# Patient Record
Sex: Male | Born: 1955 | Race: White | Hispanic: No | Smoking: Current every day smoker
Health system: Southern US, Community
[De-identification: ages and names within clinical notes are randomized; demographics above are authoritative.]

## PROBLEM LIST (undated history)

## (undated) DIAGNOSIS — J449 Chronic obstructive pulmonary disease, unspecified: Secondary | ICD-10-CM

## (undated) HISTORY — PX: KNEE SURGERY: SHX244

## (undated) HISTORY — PX: ANKLE SURGERY: SHX546

## (undated) HISTORY — PX: APPENDECTOMY: SHX54

---

## 2017-11-21 ENCOUNTER — Encounter (HOSPITAL_COMMUNITY)
Admission: EM | Disposition: A | Discharge status: Home / Self Care | Payer: Self-pay | Source: Home / Self Care | Attending: Cardiovascular Disease

## 2017-11-21 ENCOUNTER — Inpatient Hospital Stay (HOSPITAL_COMMUNITY): Payer: No Typology Code available for payment source

## 2017-11-21 ENCOUNTER — Other Ambulatory Visit: Payer: Self-pay

## 2017-11-21 ENCOUNTER — Inpatient Hospital Stay (HOSPITAL_COMMUNITY)
Admission: EM | Admit: 2017-11-21 | Discharge: 2017-11-23 | DRG: 247 | Disposition: A | Discharge status: Home / Self Care | Payer: No Typology Code available for payment source | Attending: Cardiovascular Disease | Admitting: Cardiovascular Disease

## 2017-11-21 ENCOUNTER — Emergency Department (HOSPITAL_COMMUNITY): Payer: No Typology Code available for payment source

## 2017-11-21 ENCOUNTER — Encounter (HOSPITAL_COMMUNITY): Payer: Self-pay

## 2017-11-21 DIAGNOSIS — J449 Chronic obstructive pulmonary disease, unspecified: Secondary | ICD-10-CM | POA: Diagnosis present

## 2017-11-21 DIAGNOSIS — I2510 Atherosclerotic heart disease of native coronary artery without angina pectoris: Secondary | ICD-10-CM | POA: Diagnosis present

## 2017-11-21 DIAGNOSIS — F17210 Nicotine dependence, cigarettes, uncomplicated: Secondary | ICD-10-CM | POA: Diagnosis present

## 2017-11-21 DIAGNOSIS — E785 Hyperlipidemia, unspecified: Secondary | ICD-10-CM | POA: Diagnosis present

## 2017-11-21 DIAGNOSIS — Z79899 Other long term (current) drug therapy: Secondary | ICD-10-CM

## 2017-11-21 DIAGNOSIS — I213 ST elevation (STEMI) myocardial infarction of unspecified site: Secondary | ICD-10-CM | POA: Diagnosis present

## 2017-11-21 DIAGNOSIS — Z720 Tobacco use: Secondary | ICD-10-CM

## 2017-11-21 DIAGNOSIS — I2121 ST elevation (STEMI) myocardial infarction involving left circumflex coronary artery: Secondary | ICD-10-CM | POA: Diagnosis present

## 2017-11-21 DIAGNOSIS — I5030 Unspecified diastolic (congestive) heart failure: Secondary | ICD-10-CM

## 2017-11-21 DIAGNOSIS — Z9861 Coronary angioplasty status: Secondary | ICD-10-CM

## 2017-11-21 DIAGNOSIS — R079 Chest pain, unspecified: Secondary | ICD-10-CM

## 2017-11-21 DIAGNOSIS — Z955 Presence of coronary angioplasty implant and graft: Secondary | ICD-10-CM

## 2017-11-21 HISTORY — DX: Chronic obstructive pulmonary disease, unspecified: J44.9

## 2017-11-21 HISTORY — PX: LEFT HEART CATH AND CORONARY ANGIOGRAPHY: CATH118249

## 2017-11-21 HISTORY — PX: CORONARY/GRAFT ACUTE MI REVASCULARIZATION: CATH118305

## 2017-11-21 LAB — COMPREHENSIVE METABOLIC PANEL
ALT: 15 U/L (ref 0–44)
AST: 17 U/L (ref 15–41)
Albumin: 3.8 g/dL (ref 3.5–5.0)
Alkaline Phosphatase: 70 U/L (ref 38–126)
Anion gap: 7 (ref 5–15)
BILIRUBIN TOTAL: 0.6 mg/dL (ref 0.3–1.2)
BUN: 17 mg/dL (ref 6–20)
CO2: 26 mmol/L (ref 22–32)
Calcium: 9 mg/dL (ref 8.9–10.3)
Chloride: 105 mmol/L (ref 98–111)
Creatinine, Ser: 0.94 mg/dL (ref 0.61–1.24)
GFR calc Af Amer: >60 mL/min (ref >60)
GLUCOSE: 150 mg/dL — AB (ref 70–99)
Potassium: 3.8 mmol/L (ref 3.5–5.1)
Sodium: 138 mmol/L (ref 135–145)
TOTAL PROTEIN: 6.4 g/dL — AB (ref 6.5–8.1)

## 2017-11-21 LAB — LIPID PANEL
CHOL/HDL RATIO: 7.3 ratio
Cholesterol: 257 mg/dL — ABNORMAL HIGH (ref 0–200)
HDL: 35 mg/dL — ABNORMAL LOW (ref >40)
LDL CALC: 200 mg/dL — AB (ref 0–99)
Triglycerides: 112 mg/dL (ref <150)
VLDL: 22 mg/dL (ref 0–40)

## 2017-11-21 LAB — ECHOCARDIOGRAM COMPLETE
HEIGHTINCHES: 70 in
Weight: 3280.44 oz

## 2017-11-21 LAB — POCT I-STAT, CHEM 8
BUN: 20 mg/dL (ref 6–20)
CALCIUM ION: 1.18 mmol/L (ref 1.15–1.40)
CHLORIDE: 104 mmol/L (ref 98–111)
Creatinine, Ser: 0.8 mg/dL (ref 0.61–1.24)
GLUCOSE: 143 mg/dL — AB (ref 70–99)
HCT: 40 % (ref 39.0–52.0)
Hemoglobin: 13.6 g/dL (ref 13.0–17.0)
POTASSIUM: 3.7 mmol/L (ref 3.5–5.1)
Sodium: 139 mmol/L (ref 135–145)
TCO2: 25 mmol/L (ref 22–32)

## 2017-11-21 LAB — HEMOGLOBIN A1C
Hgb A1c MFr Bld: 5.4 % (ref 4.8–5.6)
Mean Plasma Glucose: 108.28 mg/dL

## 2017-11-21 LAB — CBC WITH DIFFERENTIAL/PLATELET
Abs Immature Granulocytes: 0.08 10*3/uL — ABNORMAL HIGH (ref 0.00–0.07)
Basophils Absolute: 0.1 10*3/uL (ref 0.0–0.1)
Basophils Relative: 1 %
EOS ABS: 0.2 10*3/uL (ref 0.0–0.5)
EOS PCT: 1 %
HEMATOCRIT: 46.2 % (ref 39.0–52.0)
Hemoglobin: 14 g/dL (ref 13.0–17.0)
Immature Granulocytes: 1 %
LYMPHS ABS: 1.9 10*3/uL (ref 0.7–4.0)
Lymphocytes Relative: 16 %
MCH: 28 pg (ref 26.0–34.0)
MCHC: 30.3 g/dL (ref 30.0–36.0)
MCV: 92.4 fL (ref 80.0–100.0)
MONOS PCT: 7 %
Monocytes Absolute: 0.9 10*3/uL (ref 0.1–1.0)
NRBC: 0 % (ref 0.0–0.2)
Neutro Abs: 8.9 10*3/uL — ABNORMAL HIGH (ref 1.7–7.7)
Neutrophils Relative %: 74 %
Platelets: 369 10*3/uL (ref 150–400)
RBC: 5 MIL/uL (ref 4.22–5.81)
RDW: 13.8 % (ref 11.5–15.5)
WBC: 12 10*3/uL — ABNORMAL HIGH (ref 4.0–10.5)

## 2017-11-21 LAB — TROPONIN I
TROPONIN I: 0.07 ng/mL — AB (ref <0.03)
TROPONIN I: 2.43 ng/mL — AB (ref <0.03)
TROPONIN I: 9.82 ng/mL — AB (ref <0.03)
Troponin I: 11.32 ng/mL (ref <0.03)

## 2017-11-21 LAB — POCT ACTIVATED CLOTTING TIME
ACTIVATED CLOTTING TIME: 351 s
Activated Clotting Time: 835 seconds

## 2017-11-21 LAB — PROTIME-INR
INR: 0.95
Prothrombin Time: 12.6 seconds (ref 11.4–15.2)

## 2017-11-21 LAB — APTT: aPTT: 29 seconds (ref 24–36)

## 2017-11-21 LAB — TSH: TSH: 0.966 u[IU]/mL (ref 0.350–4.500)

## 2017-11-21 LAB — T4, FREE: FREE T4: 0.89 ng/dL (ref 0.82–1.77)

## 2017-11-21 LAB — MAGNESIUM: Magnesium: 2.2 mg/dL (ref 1.7–2.4)

## 2017-11-21 LAB — PLATELET COUNT: Platelets: 304 10*3/uL (ref 150–400)

## 2017-11-21 LAB — MRSA PCR SCREENING: MRSA by PCR: NEGATIVE

## 2017-11-21 LAB — BRAIN NATRIURETIC PEPTIDE: B Natriuretic Peptide: 41.1 pg/mL (ref 0.0–100.0)

## 2017-11-21 LAB — HIV ANTIBODY (ROUTINE TESTING W REFLEX): HIV Screen 4th Generation wRfx: NONREACTIVE

## 2017-11-21 SURGERY — CORONARY/GRAFT ACUTE MI REVASCULARIZATION
Anesthesia: LOCAL

## 2017-11-21 MED ORDER — ATORVASTATIN CALCIUM 80 MG PO TABS: 80.0000 mg | ORAL_TABLET | Freq: Every day | ORAL | Status: DC

## 2017-11-21 MED ORDER — SODIUM CHLORIDE 0.9 % IV SOLN
INTRAVENOUS | Status: AC | PRN
  Administered 2017-11-21: 250 mL
  Administered 2017-11-21: 100 mL/h via INTRAVENOUS

## 2017-11-21 MED ORDER — IOHEXOL 350 MG/ML SOLN
INTRAVENOUS | Status: DC | PRN
  Administered 2017-11-21: 295 mL via INTRAVENOUS

## 2017-11-21 MED ORDER — ORAL CARE MOUTH RINSE
15.0000 mL | Freq: Two times a day (BID) | OROMUCOSAL | Status: DC
  Administered 2017-11-23: 15 mL via OROMUCOSAL

## 2017-11-21 MED ORDER — BIVALIRUDIN TRIFLUOROACETATE 250 MG IV SOLR
INTRAVENOUS | Status: AC
  Filled 2017-11-21: qty 250

## 2017-11-21 MED ORDER — PERFLUTREN LIPID MICROSPHERE
1.0000 mL | INTRAVENOUS | Status: AC | PRN
  Administered 2017-11-21: 4 mL via INTRAVENOUS
  Filled 2017-11-21: qty 10

## 2017-11-21 MED ORDER — BIVALIRUDIN BOLUS VIA INFUSION - CUPID
INTRAVENOUS | Status: DC | PRN
  Administered 2017-11-21: 99.678 mg via INTRAVENOUS

## 2017-11-21 MED ORDER — SODIUM CHLORIDE 0.9% FLUSH
3.0000 mL | Freq: Two times a day (BID) | INTRAVENOUS | Status: DC
  Administered 2017-11-21 (×2): 3 mL via INTRAVENOUS

## 2017-11-21 MED ORDER — HEPARIN (PORCINE) IN NACL 1000-0.9 UT/500ML-% IV SOLN
INTRAVENOUS | Status: DC | PRN
  Administered 2017-11-21: 500 mL

## 2017-11-21 MED ORDER — ASPIRIN EC 81 MG PO TBEC: 81.0000 mg | DELAYED_RELEASE_TABLET | Freq: Every day | ORAL | Status: DC

## 2017-11-21 MED ORDER — HEPARIN SODIUM (PORCINE) 5000 UNIT/ML IJ SOLN
4000.0000 [IU] | Freq: Once | INTRAMUSCULAR | Status: AC
  Administered 2017-11-21: 4000 [IU] via INTRAVENOUS

## 2017-11-21 MED ORDER — VERAPAMIL HCL 2.5 MG/ML IV SOLN
INTRAVENOUS | Status: DC | PRN
  Administered 2017-11-21: 10 mL via INTRA_ARTERIAL

## 2017-11-21 MED ORDER — PERFLUTREN LIPID MICROSPHERE
INTRAVENOUS | Status: AC
  Filled 2017-11-21: qty 10

## 2017-11-21 MED ORDER — ONDANSETRON HCL 4 MG/2ML IJ SOLN: 4.0000 mg | Freq: Four times a day (QID) | INTRAMUSCULAR | Status: DC | PRN

## 2017-11-21 MED ORDER — DIAZEPAM 5 MG PO TABS: 5.0000 mg | ORAL_TABLET | Freq: Four times a day (QID) | ORAL | Status: DC | PRN

## 2017-11-21 MED ORDER — ATORVASTATIN CALCIUM 80 MG PO TABS
80.0000 mg | ORAL_TABLET | Freq: Every day | ORAL | Status: DC
  Administered 2017-11-21 – 2017-11-22 (×2): 80 mg via ORAL
  Filled 2017-11-21 (×2): qty 1

## 2017-11-21 MED ORDER — NITROGLYCERIN 1 MG/10 ML FOR IR/CATH LAB
INTRA_ARTERIAL | Status: DC | PRN
  Administered 2017-11-21: 100 ug via INTRACORONARY
  Administered 2017-11-21 (×3): 200 ug via INTRACORONARY

## 2017-11-21 MED ORDER — TIROFIBAN HCL IN NACL 5-0.9 MG/100ML-% IV SOLN
INTRAVENOUS | Status: AC | PRN
  Administered 2017-11-21: 0.15 ug/kg/min via INTRAVENOUS

## 2017-11-21 MED ORDER — LEVALBUTEROL HCL 0.63 MG/3ML IN NEBU
0.6300 mg | INHALATION_SOLUTION | Freq: Three times a day (TID) | RESPIRATORY_TRACT | Status: DC
  Administered 2017-11-21 (×2): 0.63 mg via RESPIRATORY_TRACT
  Filled 2017-11-21 (×2): qty 3

## 2017-11-21 MED ORDER — VERAPAMIL HCL 2.5 MG/ML IV SOLN
INTRAVENOUS | Status: AC
  Filled 2017-11-21: qty 2

## 2017-11-21 MED ORDER — TIROFIBAN HCL IN NACL 5-0.9 MG/100ML-% IV SOLN
INTRAVENOUS | Status: AC
  Filled 2017-11-21: qty 100

## 2017-11-21 MED ORDER — SODIUM CHLORIDE 0.9% FLUSH: 3.0000 mL | INTRAVENOUS | Status: DC | PRN

## 2017-11-21 MED ORDER — MIDAZOLAM HCL 2 MG/2ML IJ SOLN
INTRAMUSCULAR | Status: AC
  Filled 2017-11-21: qty 2

## 2017-11-21 MED ORDER — IPRATROPIUM-ALBUTEROL 0.5-2.5 (3) MG/3ML IN SOLN
3.0000 mL | RESPIRATORY_TRACT | Status: DC
  Administered 2017-11-21 – 2017-11-23 (×9): 3 mL via RESPIRATORY_TRACT
  Filled 2017-11-21 (×9): qty 3

## 2017-11-21 MED ORDER — METOPROLOL TARTRATE 12.5 MG HALF TABLET
12.5000 mg | ORAL_TABLET | Freq: Two times a day (BID) | ORAL | Status: DC
  Administered 2017-11-21 – 2017-11-23 (×5): 12.5 mg via ORAL
  Filled 2017-11-21 (×5): qty 1

## 2017-11-21 MED ORDER — TIROFIBAN HCL IN NACL 5-0.9 MG/100ML-% IV SOLN
0.1500 ug/kg/min | INTRAVENOUS | Status: DC
  Administered 2017-11-21 (×3): 0.15 ug/kg/min via INTRAVENOUS
  Filled 2017-11-21 (×5): qty 100

## 2017-11-21 MED ORDER — HYDRALAZINE HCL 20 MG/ML IJ SOLN: 5.0000 mg | INTRAMUSCULAR | Status: AC | PRN

## 2017-11-21 MED ORDER — ASPIRIN 81 MG PO CHEW: 324.0000 mg | CHEWABLE_TABLET | Freq: Once | ORAL | Status: DC

## 2017-11-21 MED ORDER — MIDAZOLAM HCL 2 MG/2ML IJ SOLN
INTRAMUSCULAR | Status: DC | PRN
  Administered 2017-11-21 (×2): 1 mg via INTRAVENOUS
  Administered 2017-11-21: 2 mg via INTRAVENOUS

## 2017-11-21 MED ORDER — FENTANYL CITRATE (PF) 100 MCG/2ML IJ SOLN
INTRAMUSCULAR | Status: DC | PRN
  Administered 2017-11-21 (×2): 25 ug via INTRAVENOUS

## 2017-11-21 MED ORDER — NITROGLYCERIN 1 MG/10 ML FOR IR/CATH LAB
INTRA_ARTERIAL | Status: AC
  Filled 2017-11-21: qty 10

## 2017-11-21 MED ORDER — TIROFIBAN (AGGRASTAT) BOLUS VIA INFUSION
INTRAVENOUS | Status: DC | PRN
  Administered 2017-11-21: 2325 ug via INTRAVENOUS

## 2017-11-21 MED ORDER — TICAGRELOR 90 MG PO TABS
90.0000 mg | ORAL_TABLET | Freq: Two times a day (BID) | ORAL | Status: DC
  Administered 2017-11-21 – 2017-11-23 (×4): 90 mg via ORAL
  Filled 2017-11-21 (×4): qty 1

## 2017-11-21 MED ORDER — SODIUM CHLORIDE 0.9 % IV SOLN: 250.0000 mL | INTRAVENOUS | Status: DC | PRN

## 2017-11-21 MED ORDER — ACETAMINOPHEN 325 MG PO TABS: 650.0000 mg | ORAL_TABLET | ORAL | Status: DC | PRN

## 2017-11-21 MED ORDER — FENTANYL CITRATE (PF) 100 MCG/2ML IJ SOLN
INTRAMUSCULAR | Status: AC
  Filled 2017-11-21: qty 2

## 2017-11-21 MED ORDER — METOPROLOL TARTRATE 5 MG/5ML IV SOLN
INTRAVENOUS | Status: AC
  Filled 2017-11-21: qty 5

## 2017-11-21 MED ORDER — MOMETASONE FURO-FORMOTEROL FUM 200-5 MCG/ACT IN AERO
2.0000 | INHALATION_SPRAY | Freq: Two times a day (BID) | RESPIRATORY_TRACT | Status: DC
  Administered 2017-11-21 – 2017-11-23 (×3): 2 via RESPIRATORY_TRACT
  Filled 2017-11-21 (×2): qty 8.8

## 2017-11-21 MED ORDER — NICOTINE 14 MG/24HR TD PT24
14.0000 mg | MEDICATED_PATCH | Freq: Every day | TRANSDERMAL | Status: DC
  Administered 2017-11-21 – 2017-11-23 (×3): 14 mg via TRANSDERMAL
  Filled 2017-11-21 (×3): qty 1

## 2017-11-21 MED ORDER — HEPARIN (PORCINE) IN NACL 1000-0.9 UT/500ML-% IV SOLN
INTRAVENOUS | Status: AC
  Filled 2017-11-21: qty 1000

## 2017-11-21 MED ORDER — SODIUM CHLORIDE 0.9 % IV SOLN
1.7500 mg/kg/h | INTRAVENOUS | Status: AC
  Administered 2017-11-21: 1.75 mg/kg/h via INTRAVENOUS
  Filled 2017-11-21 (×2): qty 250

## 2017-11-21 MED ORDER — ACETAMINOPHEN 325 MG PO TABS
650.0000 mg | ORAL_TABLET | ORAL | Status: DC | PRN
  Administered 2017-11-22: 650 mg via ORAL
  Filled 2017-11-21: qty 2

## 2017-11-21 MED ORDER — SODIUM CHLORIDE 0.9 % IV SOLN
INTRAVENOUS | Status: DC
  Administered 2017-11-21 (×2): via INTRAVENOUS

## 2017-11-21 MED ORDER — ASPIRIN 81 MG PO CHEW
81.0000 mg | CHEWABLE_TABLET | Freq: Every day | ORAL | Status: DC
  Administered 2017-11-22 – 2017-11-23 (×2): 81 mg via ORAL
  Filled 2017-11-21 (×3): qty 1

## 2017-11-21 MED ORDER — NITROGLYCERIN 0.4 MG SL SUBL: 0.4000 mg | SUBLINGUAL_TABLET | SUBLINGUAL | Status: DC | PRN

## 2017-11-21 MED ORDER — LIDOCAINE HCL (PF) 1 % IJ SOLN
INTRAMUSCULAR | Status: DC | PRN
  Administered 2017-11-21: 2 mL

## 2017-11-21 MED ORDER — SODIUM CHLORIDE 0.9 % IV SOLN: INTRAVENOUS | Status: DC

## 2017-11-21 MED ORDER — TIROFIBAN HCL IN NACL 5-0.9 MG/100ML-% IV SOLN
INTRAVENOUS | Status: AC | PRN
  Administered 2017-11-21: 0.075 ug/kg/min via INTRAVENOUS

## 2017-11-21 MED ORDER — TICAGRELOR 90 MG PO TABS
ORAL_TABLET | ORAL | Status: AC
  Filled 2017-11-21: qty 2

## 2017-11-21 MED ORDER — TICAGRELOR 90 MG PO TABS
ORAL_TABLET | ORAL | Status: DC | PRN
  Administered 2017-11-21: 180 mg via ORAL

## 2017-11-21 MED ORDER — LABETALOL HCL 5 MG/ML IV SOLN: 10.0000 mg | INTRAVENOUS | Status: AC | PRN

## 2017-11-21 MED ORDER — SODIUM CHLORIDE 0.9 % IV SOLN
INTRAVENOUS | Status: AC | PRN
  Administered 2017-11-21 (×2): 1.75 mg/kg/h via INTRAVENOUS

## 2017-11-21 MED ORDER — ALPRAZOLAM 0.25 MG PO TABS: 0.2500 mg | ORAL_TABLET | Freq: Two times a day (BID) | ORAL | Status: DC | PRN

## 2017-11-21 MED ORDER — LIDOCAINE HCL (PF) 1 % IJ SOLN
INTRAMUSCULAR | Status: AC
  Filled 2017-11-21: qty 30

## 2017-11-21 MED ORDER — METOPROLOL TARTRATE 5 MG/5ML IV SOLN
INTRAVENOUS | Status: DC | PRN
  Administered 2017-11-21: 5 mg via INTRAVENOUS

## 2017-11-21 SURGICAL SUPPLY — 21 items
BALLN SAPPHIRE 2.0X12 (BALLOONS) ×2
BALLN SAPPHIRE 2.5X15 (BALLOONS) ×2
BALLN SAPPHIRE ~~LOC~~ 3.0X15 (BALLOONS) ×2 IMPLANT
BALLOON SAPPHIRE 2.0X12 (BALLOONS) ×1 IMPLANT
BALLOON SAPPHIRE 2.5X15 (BALLOONS) ×1 IMPLANT
CATH INFINITI 5FR ANG PIGTAIL (CATHETERS) ×2 IMPLANT
CATH INFINITI JR4 5F (CATHETERS) ×2 IMPLANT
CATH OPTITORQUE TIG 4.0 5F (CATHETERS) ×2 IMPLANT
CATH VISTA GUIDE 6FR XB3.5 (CATHETERS) ×2 IMPLANT
DEVICE RAD COMP TR BAND LRG (VASCULAR PRODUCTS) ×2 IMPLANT
GLIDESHEATH SLEND SS 6F .021 (SHEATH) ×2 IMPLANT
GUIDEWIRE INQWIRE 1.5J.035X260 (WIRE) ×1 IMPLANT
INQWIRE 1.5J .035X260CM (WIRE) ×2
KIT ENCORE 26 ADVANTAGE (KITS) ×2 IMPLANT
KIT HEART LEFT (KITS) ×2 IMPLANT
PACK CARDIAC CATHETERIZATION (CUSTOM PROCEDURE TRAY) ×2 IMPLANT
STENT SYNERGY DES 2.75X20 (Permanent Stent) ×2 IMPLANT
SYR MEDRAD MARK V 150ML (SYRINGE) ×2 IMPLANT
TRANSDUCER W/STOPCOCK (MISCELLANEOUS) ×2 IMPLANT
TUBING CIL FLEX 10 FLL-RA (TUBING) ×2 IMPLANT
WIRE PT2 MS 185 (WIRE) ×2 IMPLANT

## 2017-11-21 NOTE — Progress Notes (Signed)
  Echocardiogram 2D Echocardiogram has been performed.  Belva Chimes 11/21/2017, 3:07 PM

## 2017-11-21 NOTE — ED Notes (Signed)
Patient heading to cath lab with RN

## 2017-11-21 NOTE — Progress Notes (Signed)
CRITICAL VALUE ALERT  Critical Value:  Troponin 2.43  Date & Time Notied:  11/21/2017  At  0955  Provider Notified: paged NP Julien Girt at [phone] and [phone]  Orders Received/Actions taken: NP Julien Girt returned call at 1015, expected post cath troponin value

## 2017-11-21 NOTE — Progress Notes (Signed)
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   11/21/17 0700  Clinical Encounter Type  Visited With Patient not available   Phebe Colla, Chaplain

## 2017-11-21 NOTE — H&P (Addendum)
Cardiology Admission History and Physical:   Patient ID: Robert Cox MRN: [id number]; DOB: [birth year]   Admission date: 11/21/2017  Primary Care Provider: No primary care provider on file. 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  He took tums yesterday several times with mild improvement and symptoms continued.  He did fall asleep for 2 hours then woke with worsening pain.  This is described as tightness/heaviness in center of his chest.  Some SOB no associated nausea or diaphoresis.  Lives in [place].  Hx of COPD  EKG with ST elevation in II, III, AVF.  J point elevation in lateral leads.   I  Personally reviewed.   CODE STEMI was called and pt taken to cath lab for emergent cath.    WBC 12.0, Hgb 14, pls 369, INR 0.95,  CMP pending   CXR :  Mild left-sided atelectasis noted; lungs otherwise clear.   Past Medical History:  Diagnosis Date  . COPD (chronic obstructive pulmonary disease) (HCC)     Past Surgical History:  Procedure Laterality Date  . ANKLE SURGERY    . APPENDECTOMY    . KNEE SURGERY       Medications Prior to Admission: Prior to Admission medications   Not on File   pt on medication for prostate and albuterol neb.  He does not know meds.   Allergies:   Not on File  Social History:   Social History   Socioeconomic History  . Marital status: Not on file    Spouse name: Not on file  . Number of children: Not on file  . Years of education: Not on file  . Highest education level: Not on file  Occupational History  . Not on file  Social Needs  . Financial resource strain: Not on file  . Food insecurity:   Worry: Not on file    Inability: Not on file  . Transportation needs:    Medical: Not on file    Non-medical: Not on file  Tobacco Use  . Smoking status: Current Every Day Smoker    Packs/day: 4.00  . Smokeless tobacco: Never Used  Substance and Sexual Activity  . Alcohol use: Yes    Comment: occ  . Drug use: Never  . Sexual activity: Not on file  Lifestyle  . Physical activity:    Days per week: Not on file    Minutes per session: Not on file  . Stress: Not on file  Relationships  . Social connections:    Talks on phone: Not on file    Gets together: Not on file    Attends religious service: Not on file    Active member of club or organization: Not on file    Attends meetings of clubs or organizations: Not on file    Relationship status: Not on file  . Intimate partner violence:    Fear of current or ex partner: Not on file    Emotionally abused: Not on file    Physically abused: Not on file    Forced sexual activity: Not on file  Other Topics Concern  . Not  on file  Social History Narrative  . Not on file    Family History:   The patient's family history includes Heart disease in his father.    ROS:  Please see the history of present illness.  General:no colds or fevers, no weight changes Skin:no rashes or ulcers HEENT:no blurred vision, no congestion CV:see HPI PUL:see HPI GI:no diarrhea constipation or melena, no indigestion GU:no hematuria, no dysuria MS:no joint pain, no claudication Neuro:no syncope, no lightheadedness Endo:no diabetes, no thyroid disease All other ROS reviewed and negative.     Physical Exam/Data:   Vitals:   11/21/17 0524  SpO2: 92%   No intake or output data in the 24 hours ending 11/21/17 0728 There were no vitals filed for this visit. There is no height or weight on file to calculate BMI.  General:  Well nourished, well developed, in no acute distress though anxious HEENT: normal Lymph: no adenopathy Neck: no  JVD Endocrine:  No thryomegaly Vascular: No carotid bruits; pedal pulses 3+ bilaterally  Cardiac:  normal S1, S2; RRR; no murmur gallup rub or click Lungs:  clear to auscultation bilaterally, ant position, no wheezing, rhonchi or rales  Abd: soft, nontender, no hepatomegaly  Ext: no lower ext edema Musculoskeletal:  No deformities, BUE and BLE strength normal and equal Skin: warm and dry  Neuro:  Alert and oriented X 3 MAE follows commands, no focal abnormalities noted Psych:  Normal affect      Relevant CV Studies: None prior to admit  Laboratory Data:  Chemistry Recent Labs  Lab 11/21/17 0528  NA 138  K 3.8  CL 105  CO2 26  GLUCOSE 150*  BUN 17  CREATININE 0.94  CALCIUM 9.0  GFRNONAA >60  GFRAA >60  ANIONGAP 7    Recent Labs  Lab 11/21/17 0528  PROT 6.4*  ALBUMIN 3.8  AST 17  ALT 15  ALKPHOS 70  BILITOT 0.6   Hematology Recent Labs  Lab 11/21/17 0528  WBC 12.0*  RBC 5.00  HGB 14.0  HCT 46.2  MCV 92.4  MCH 28.0  MCHC 30.3  RDW 13.8  PLT 369   Cardiac Enzymes Recent Labs  Lab 11/21/17 0528  TROPONINI 0.07*   No results for input(s): TROPIPOC in the last 168 hours.  BNPNo results for input(s): BNP, PROBNP in the last 168 hours.  DDimer No results for input(s): DDIMER in the last 168 hours.  Radiology/Studies:  Dg Chest Port 1 View  Result Date: 11/21/2017 CLINICAL DATA:  Code ST-elevation myocardial infarction. Acute onset of generalized chest pain. EXAM: PORTABLE CHEST 1 VIEW COMPARISON:  None. FINDINGS: The lungs are well-aerated. Mild left-sided atelectasis is noted. No pleural effusion or pneumothorax is seen. The cardiomediastinal silhouette is within normal limits. No acute osseous abnormalities are seen. IMPRESSION: Mild left-sided atelectasis noted; lungs otherwise clear. Electronically Signed   By: Roanna Raider M.D.   On: 11/21/2017 05:43    Assessment and Plan:   1. STEMI Inf lat wall, emergently to cath lab, serial troponins,  asa has been given. Admit to ICU. 2. COPD on albuterol nebulizer at home.  3. + tobacco. Added xanax and nicoderm patch  Severity of Illness: The appropriate patient status for this patient is INPATIENT. Inpatient status is judged to be reasonable and necessary in order to provide the required intensity of service to ensure the patient's safety. The patient's presenting symptoms, physical exam findings, and initial radiographic and laboratory data in the context of their chronic comorbidities is  felt to place them at high risk for further clinical deterioration. Furthermore, it is not anticipated that the patient will be medically stable for discharge from the hospital within 2 midnights of admission. The following factors support the patient status of inpatient.   " The patient's presenting symptoms include severe chest pain. " The worrisome physical exam findings include continued chest pain. " The initial radiographic and laboratory data are worrisome because of EKG with ST elevation in inf lat leads. " The chronic co-morbidities include COPD.   * I certify that at the point of admission it is my clinical judgment that the patient will require inpatient hospital care spanning beyond 2 midnights from the point of admission due to high intensity of service, high risk for further deterioration and high frequency of surveillance required.*    For questions or updates, please contact [facility] Please consult [email or web address] for contact info under        Signed, Nada Boozer, NP  11/21/2017 7:28 AM   Patient seen and examined. Agree with assessment and plan.  Robert Cox is a 62 year old gentleman who resides in [place].  He has a history of long-standing tobacco abuse.  He started smoking at age 59 and has been smoking 4 packs/day for many years.  He has a history of COPD.  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  He developed new onset chest pressure yesterday afternoon  which persisted throughout the day and into the evening.  He was awakened this morning with more significant chest pain leading to current presentation.  When EMS arrived initial ECG showed early inferior ST elevation.  A code STEMI was activated.  Upon arrival to the hospital the patient was still having ongoing chest pressure.  He is taken emergently to the catheterization laboratory for emergent catheterization.   Lennette Bihari, MD, [facility] 11/21/2017 9:15 AM

## 2017-11-21 NOTE — ED Triage Notes (Signed)
Pt bib [facility]. 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 9/10 chest pain. Ems gave 324asa and 1 nitro without relief.

## 2017-11-21 NOTE — ED Provider Notes (Signed)
[facility] EMERGENCY DEPARTMENT Provider Note   CSN: [id number] Arrival date & time: 11/21/17  7829     History   Chief Complaint Chief Complaint  Patient presents with  . Code STEMI    HPI Robert Cox is a 62 y.o. male.  Patient presents to the emergency department for evaluation of chest pain.  Patient reports that he started to have chest pain yesterday afternoon.  He thought it was indigestion, although he does not frequently have indigestion.  He reports taking multiple Tums with some improvement.  Symptoms continued through the evening.  He reports that he was able to finally fall asleep late tonight, slept for approximately 2 hours and then woke up with worsening pain.  Patient reports that tightness and a heaviness in the center of his chest with very slight shortness of breath.  No nausea or diaphoresis.     Past Medical History:  Diagnosis Date  . COPD (chronic obstructive pulmonary disease) (HCC)     There are no active problems to display for this patient.   History reviewed. No pertinent surgical history.      Home Medications    Prior to Admission medications   Not on File    Family History No family history on file.  Social History Social History   Tobacco Use  . Smoking status: Current Every Day Smoker    Packs/day: 4.00  . Smokeless tobacco: Never Used  Substance Use Topics  . Alcohol use: Yes    Comment: occ  . Drug use: Never     Allergies   Patient has no allergy information on record.   Review of Systems Review of Systems  Respiratory: Positive for shortness of breath.   Cardiovascular: Positive for chest pain.  All other systems reviewed and are negative.    Physical Exam Updated Vital Signs SpO2 92%   Physical Exam  Constitutional: He is oriented to person, place, and time. He appears well-developed and well-nourished. No distress.  HENT:  Head: Normocephalic and atraumatic.  Right Ear:  Hearing normal.  Left Ear: Hearing normal.  Nose: Nose normal.  Mouth/Throat: Oropharynx is clear and moist and mucous membranes are normal.  Eyes: Pupils are equal, round, and reactive to light. Conjunctivae and EOM are normal.  Neck: Normal range of motion. Neck supple.  Cardiovascular: Regular rhythm, S1 normal and S2 normal. Exam reveals no gallop and no friction rub.  No murmur heard. Pulmonary/Chest: Effort normal and breath sounds normal. No respiratory distress. He exhibits no tenderness.  Abdominal: Soft. Normal appearance and bowel sounds are normal. There is no hepatosplenomegaly. There is no tenderness. There is no rebound, no guarding, no tenderness at McBurney's point and negative Murphy's sign. No hernia.  Musculoskeletal: Normal range of motion.  Neurological: He is alert and oriented to person, place, and time. He has normal strength. No cranial nerve deficit or sensory deficit. Coordination normal. GCS eye subscore is 4. GCS verbal subscore is 5. GCS motor subscore is 6.  Skin: Skin is warm, dry and intact. No rash noted. No cyanosis.  Psychiatric: He has a normal mood and affect. His speech is normal and behavior is normal. Thought content normal.  Nursing note and vitals reviewed.    ED Treatments / Results  Labs (all labs ordered are listed, but only abnormal results are displayed) Labs Reviewed  CBC WITH DIFFERENTIAL/PLATELET  PROTIME-INR  APTT  COMPREHENSIVE METABOLIC PANEL  TROPONIN I  LIPID PANEL    EKG None  Radiology Dg Chest Port 1 View  Result Date: 11/21/2017 CLINICAL DATA:  Code ST-elevation myocardial infarction. Acute onset of generalized chest pain. EXAM: PORTABLE CHEST 1 VIEW COMPARISON:  None. FINDINGS: The lungs are well-aerated. Mild left-sided atelectasis is noted. No pleural effusion or pneumothorax is seen. The cardiomediastinal silhouette is within normal limits. No acute osseous abnormalities are seen. IMPRESSION: Mild left-sided  atelectasis noted; lungs otherwise clear. Electronically Signed   By: Roanna Raider M.D.   On: 11/21/2017 05:43    Procedures Procedures (including critical care time)  Medications Ordered in ED Medications  0.9 %  sodium chloride infusion (has no administration in time range)  aspirin chewable tablet 324 mg ( Oral MAR Hold 11/21/17 0548)  heparin injection 4,000 Units (4,000 Units Intravenous Given 11/21/17 0536)     Initial Impression / Assessment and Plan / ED Course  I have reviewed the triage vital signs and the nursing notes.  Pertinent labs & imaging results that were available during my care of the patient were reviewed by me and considered in my medical decision making (see chart for details).     Patient brought to the emergency department by EMS as a code STEMI.  Patient has been experiencing chest pain since yesterday afternoon, progressively worsening.  He is a heavy smoker, has a history of COPD.  He denies any history of hypertension, high cholesterol, diabetes.  Initial EKG for EMS does show some suggestive ST elevations in inferior leads, but no reciprocal changes.  Upon arrival to the ER, patient is still experiencing chest pain.  Discussed at length with Dr. Tresa Endo, on-call for interventional cardiology.  EKG here does show persistent, now concave upwards, ST elevations in lead III and aVF.  There is potentially subtle depressions in V1 and V2.  Patient's symptoms are worrisome for cardiac etiology.  He is, however, a truck driver.  PE is also considered.  He is not experiencing any significant shortness of breath.  Patient initiated on heparin empirically, will go to the Cath Lab for initial cardiac work-up.  CRITICAL CARE Performed by: Gilda Crease   Total critical care time: 35 minutes  Critical care time was exclusive of separately billable procedures and treating other patients.  Critical care was necessary to treat or prevent imminent or  life-threatening deterioration.  Critical care was time spent personally by me on the following activities: development of treatment plan with patient and/or surrogate as well as nursing, discussions with consultants, evaluation of patient's response to treatment, examination of patient, obtaining history from patient or surrogate, ordering and performing treatments and interventions, ordering and review of laboratory studies, ordering and review of radiographic studies, pulse oximetry and re-evaluation of patient's condition.   Final Clinical Impressions(s) / ED Diagnoses   Final diagnoses:  STEMI (ST elevation myocardial infarction) [facility])    ED Discharge Orders    None       Shanterica Biehler, Canary Brim, MD 11/21/17 [phone]

## 2017-11-22 ENCOUNTER — Encounter (HOSPITAL_COMMUNITY): Payer: Self-pay | Admitting: Cardiovascular Disease

## 2017-11-22 LAB — LIPID PANEL
CHOL/HDL RATIO: 7.7 ratio
CHOLESTEROL: 230 mg/dL — AB (ref 0–200)
HDL: 30 mg/dL — ABNORMAL LOW (ref >40)
LDL Cholesterol: 177 mg/dL — ABNORMAL HIGH (ref 0–99)
Triglycerides: 116 mg/dL (ref <150)
VLDL: 23 mg/dL (ref 0–40)

## 2017-11-22 LAB — BASIC METABOLIC PANEL
ANION GAP: 7 (ref 5–15)
BUN: 10 mg/dL (ref 6–20)
CO2: 24 mmol/L (ref 22–32)
Calcium: 8.7 mg/dL — ABNORMAL LOW (ref 8.9–10.3)
Chloride: 107 mmol/L (ref 98–111)
Creatinine, Ser: 0.92 mg/dL (ref 0.61–1.24)
GFR calc Af Amer: >60 mL/min (ref >60)
GFR calc non Af Amer: >60 mL/min (ref >60)
Glucose, Bld: 112 mg/dL — ABNORMAL HIGH (ref 70–99)
POTASSIUM: 3.6 mmol/L (ref 3.5–5.1)
SODIUM: 138 mmol/L (ref 135–145)

## 2017-11-22 LAB — CBC
HCT: 41.2 % (ref 39.0–52.0)
Hemoglobin: 12.9 g/dL — ABNORMAL LOW (ref 13.0–17.0)
MCH: 28.2 pg (ref 26.0–34.0)
MCHC: 31.3 g/dL (ref 30.0–36.0)
MCV: 90.2 fL (ref 80.0–100.0)
NRBC: 0 % (ref 0.0–0.2)
Platelets: 337 10*3/uL (ref 150–400)
RBC: 4.57 MIL/uL (ref 4.22–5.81)
RDW: 14 % (ref 11.5–15.5)
WBC: 11.6 10*3/uL — ABNORMAL HIGH (ref 4.0–10.5)

## 2017-11-22 NOTE — Progress Notes (Addendum)
CARDIAC REHAB PHASE I   PRE:  Rate/Rhythm: 68 SR    BP: sitting 95/69    SaO2:   MODE:  Ambulation: 470 ft   POST:  Rate/Rhythm: 95 SR    BP: sitting 106/75     SaO2:   Pt quickly ambulated hallway, no c/o. Ed completed with reinforcement of MI restrictions, stent, Brilinta, smoking cessation, diet and ex, NTG and CRPII. [facility] however pt is normally on the road for 3-4 months at a time. Discussed the need to rest for atleast a few days. 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 Understands the importance of Brilinta. He is thinking about quitting smoking and we had a good conversation about the risks and methods of quitting. He quit on Chantix previously for 9 months. He declined fake cigarette. He can walk tonight. [phone]   Harriet MassonRandi Kristan Teondra [place]burg CES, ACSM 11/22/2017 2:22 PM

## 2017-11-22 NOTE — Progress Notes (Signed)
Progress Note  Patient Name: Robert Cox Date of Encounter: 11/22/2017  Primary Cardiologist: Nicki Guadalajara, MD   Subjective   Robert Cox was seen sitting up by bedside eating his breakfast. He stated that he understood that he had a minor heart attack. 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 He said if we do not discharge him then he will leave AMA.  Inpatient Medications    Scheduled Meds: . aspirin  324 mg Oral Once  . aspirin  81 mg Oral Daily  . atorvastatin  80 mg Oral q1800  . ipratropium-albuterol  3 mL Nebulization Q4H  . mouth rinse  15 mL Mouth Rinse BID  . metoprolol tartrate  12.5 mg Oral BID  . mometasone-formoterol  2 puff Inhalation BID  . nicotine  14 mg Transdermal Daily  . sodium chloride flush  3 mL Intravenous Q12H  . ticagrelor  90 mg Oral BID   Continuous Infusions: . sodium chloride    . sodium chloride    . sodium chloride Stopped (11/22/17 0027)  . sodium chloride     PRN Meds: sodium chloride, acetaminophen, ALPRAZolam, diazepam, nitroGLYCERIN, ondansetron (ZOFRAN) IV, sodium chloride flush   Vital Signs    Vitals:   11/22/17 0400 11/22/17 0419 11/22/17 0500 11/22/17 0600  BP: (!) 96/54  110/66 103/64  Pulse: (!) 59  60 66  Resp: 20  19 18   Temp: 98.7 F (37.1 C)     TempSrc: Oral     SpO2: 91% 93% 94% 94%  Weight:   96.8 kg   Height:        Intake/Output Summary (Last 24 hours) at 11/22/2017 0735 Last data filed at 11/22/2017 0600 Gross per 24 hour  Intake 2866.91 ml  Output 2550 ml  Net 316.91 ml   Filed Weights   11/21/17 0800 11/21/17 0815 11/22/17 0500  Weight: 96 kg 93 kg 96.8 kg    Telemetry    Normal sinus rhythm- Personally Reviewed  ECG    Normal sinus rhythm without significant st or t wave changes. Personally Reviewed  Physical Exam   Physical Exam  Constitutional: He appears well-developed and well-nourished. No distress.  HENT:  Head:  Normocephalic and atraumatic.  Eyes: Conjunctivae are normal.  Cardiovascular: Normal rate, regular rhythm and normal heart sounds.  Respiratory: Effort normal and breath sounds normal. No respiratory distress. He has no wheezes.  GI: Soft. Bowel sounds are normal. He exhibits no distension. There is no tenderness.  Musculoskeletal: He exhibits no edema.  Neurological: He is alert.  Skin: He is not diaphoretic. No erythema.  Psychiatric: He has a normal mood and affect. His behavior is normal. Judgment and thought content normal.    Labs    Chemistry Recent Labs  Lab 11/21/17 0528 11/21/17 0606 11/22/17 0259  NA 138 139 138  K 3.8 3.7 3.6  CL 105 104 107  CO2 26  --  24  GLUCOSE 150* 143* 112*  BUN 17 20 10   CREATININE 0.94 0.80 0.92  CALCIUM 9.0  --  8.7*  PROT 6.4*  --   --   ALBUMIN 3.8  --   --   AST 17  --   --   ALT 15  --   --   ALKPHOS 70  --   --   BILITOT 0.6  --   --   GFRNONAA >60  --  >60  GFRAA >  60  --  >60  ANIONGAP 7  --  7     Hematology Recent Labs  Lab 11/21/17 0528 11/21/17 0606 11/21/17 1509 11/22/17 0259  WBC 12.0*  --   --  11.6*  RBC 5.00  --   --  4.57  HGB 14.0 13.6  --  12.9*  HCT 46.2 40.0  --  41.2  MCV 92.4  --   --  90.2  MCH 28.0  --   --  28.2  MCHC 30.3  --   --  31.3  RDW 13.8  --   --  14.0  PLT 369  --  304 337    Cardiac Enzymes Recent Labs  Lab 11/21/17 0528 11/21/17 0815 11/21/17 1509 11/21/17 2047  TROPONINI 0.07* 2.43* 9.82* 11.32*   No results for input(s): TROPIPOC in the last 168 hours.   BNP Recent Labs  Lab 11/21/17 0815  BNP 41.1     DDimer No results for input(s): DDIMER in the last 168 hours.   Radiology    Dg Chest Port 1 View  Result Date: 11/21/2017 CLINICAL DATA:  Code ST-elevation myocardial infarction. Acute onset of generalized chest pain. EXAM: PORTABLE CHEST 1 VIEW COMPARISON:  None. FINDINGS: The lungs are well-aerated. Mild left-sided atelectasis is noted. No pleural effusion  or pneumothorax is seen. The cardiomediastinal silhouette is within normal limits. No acute osseous abnormalities are seen. IMPRESSION: Mild left-sided atelectasis noted; lungs otherwise clear. Electronically Signed   By: Roanna RaiderJeffery  Chang M.D.   On: 11/21/2017 05:43    Cardiac Studies   Cardiac cath 11/21/17  Mid RCA lesion is 30% stenosed.  Prox LAD to Mid LAD lesion is 30% stenosed.  Ost 1st Mrg lesion is 70% stenosed.  Prox Cx to Mid Cx lesion is 100% stenosed.  Post intervention, there is a 0% residual stenosis.  LV end diastolic pressure is normal.  A stent was successfully placed.  TTE 11/21/17  - Left ventricle: The cavity size was normal. Wall thickness was   increased in a pattern of mild LVH. Systolic function was mildly   reduced. The estimated ejection fraction was in the range of 45%   to 50%. Anterolateral and inferolateral hypokinesis. Doppler   parameters are consistent with abnormal left ventricular   relaxation (grade 1 diastolic dysfunction). - Aortic valve: There was no stenosis. - Mitral valve: There was no significant regurgitation. - Right ventricle: The cavity size was normal. Systolic function   was normal. - Pulmonary arteries: No complete TR doppler jet so unable to   estimate PA systolic pressure. - Inferior vena cava: The vessel was normal in size. The   respirophasic diameter changes were in the normal range (>= 50%),   consistent with normal central venous pressure.  Impressions:  - Normal LV size with mild LV hypertrophy. EF 45-50%, anterolateral   and inferolateral hypokinesis. Nomral RV size and systolic   function. No significant valvular abnormalities.  Patient Profile     62 y.o. male with COPD and tobacco use presenting with chest pain described as tightness and heaviness that started the evening of 11/11. Brought by EMS as a code STEMI. EKG in ED showed st elevation in lead III and avF  Assessment & Plan   Inferolateral wall  STEMI Patient found to have st elevation in leads III, avF, and V3 on ekg. Troponin elevated to 11.32. Patient taken for emergent cath and is status post  pci to circumflex vessel and placement of synergy DES  stent at bifurcation. Aggrastat was started due to to initial no flow state.   Pressures remain stable ranging 70-110/40-60s and rates rangng 60-80s.  -TTE showing EF 45-50%, anterolateral and inferolateral hypokinesis, g1dd, no stenosis in valves -Lipid panel: Tcholes=230, trig=116, hdl=30, ldl=177 -A1c=5.4 -aggrastat for 18hrs -metoprolol 12.5mg  bid  -Nitroglycerin 0.4mg  sl q61minx3 prn -atorvastatin 80mg  qd -smoking cessation counseling despite patient refusing  -Dual antiplatelet-aspirin 81mg  and ticagrelor 90mg  qd for 12 months   COPD Patient saturating well on room air   -continue albuterol prn  Tobacco cessation  Smoking cessation counseling provided   -Continue nicotine patch   For questions or updates, please contact [facility] Please consult [email or web address] for contact info under        Signed, Lorenso Courier, MD  11/22/2017, 7:35 AM

## 2017-11-22 NOTE — Care Management Note (Addendum)
Case Management Note  Patient Details  Name: Robert Cox MRN: [id number] Date of Birth: Oct 09, 1955  Subjective/Objective:  62yo male presented with CP; s/p cath with stent.              Action/Plan: CM met with patient to discuss transitional needs. 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 Patient reports having [employer] benefits; PCP: Dr. Ileene Patrick, [name]; pharmacy of choice: [facility]. Brilinta 30-day free card provided; patient requesting his Rx be filled utilizing [facility] service. CM informed patient to follow-up with his local [place] for additional refills after his free 30-day supply, with patient verbalizing understanding. CM contacted Dr. Ernestina Penna office and provided an update on POC and requested f/u on new DAPT; Please fax DC summary to: Dr. Ernestina Penna office: [phone] ATTN: [name]. CM team will continue to follow.     Expected Discharge Date:                  Expected Discharge Plan:  Home/Self Care  In-House Referral:  NA  Discharge planning Services  CM Consult, Medication Assistance(Brilinta 30-day free card)  Post Acute Care Choice:  NA Choice offered to:  NA  DME Arranged:  N/A DME Agency:  NA  HH Arranged:  NA HH Agency:  NA  Status of Service:  In process, will continue to follow  If discussed at Long Length of Stay Meetings, dates discussed:    Additional Comments:  Midge Minium RN, BSN, NCM-BC, ACM-RN [phone] 11/22/2017, 10:43 AM

## 2017-11-23 DIAGNOSIS — E785 Hyperlipidemia, unspecified: Secondary | ICD-10-CM

## 2017-11-23 DIAGNOSIS — E7800 Pure hypercholesterolemia, unspecified: Secondary | ICD-10-CM

## 2017-11-23 DIAGNOSIS — Z720 Tobacco use: Secondary | ICD-10-CM

## 2017-11-23 DIAGNOSIS — I2510 Atherosclerotic heart disease of native coronary artery without angina pectoris: Secondary | ICD-10-CM

## 2017-11-23 DIAGNOSIS — Z9861 Coronary angioplasty status: Secondary | ICD-10-CM

## 2017-11-23 MED ORDER — METOPROLOL TARTRATE 25 MG PO TABS: 12.5000 mg | ORAL_TABLET | Freq: Two times a day (BID) | ORAL | 4 refills | Status: AC

## 2017-11-23 MED ORDER — NICOTINE 14 MG/24HR TD PT24: 14.0000 mg | MEDICATED_PATCH | Freq: Every day | TRANSDERMAL | 0 refills | Status: AC

## 2017-11-23 MED ORDER — ACETAMINOPHEN 325 MG PO TABS: 650.0000 mg | ORAL_TABLET | ORAL | Status: AC | PRN

## 2017-11-23 MED ORDER — ASPIRIN 81 MG PO CHEW: 81.0000 mg | CHEWABLE_TABLET | Freq: Every day | ORAL | Status: AC

## 2017-11-23 MED ORDER — NITROGLYCERIN 0.4 MG SL SUBL: 0.4000 mg | SUBLINGUAL_TABLET | SUBLINGUAL | 0 refills | Status: AC | PRN

## 2017-11-23 MED ORDER — ATORVASTATIN CALCIUM 80 MG PO TABS: 80.0000 mg | ORAL_TABLET | Freq: Every day | ORAL | 4 refills | Status: AC

## 2017-11-23 MED ORDER — TICAGRELOR 90 MG PO TABS: 90.0000 mg | ORAL_TABLET | Freq: Two times a day (BID) | ORAL | 4 refills | Status: AC

## 2017-11-23 MED FILL — NITROGLYCERIN 0.4 MG TAB SL: 0.4 | 8 days supply | Qty: 25 | Fill #0

## 2017-11-23 MED FILL — NICOTINE 14 MG/24HR PATCH: 14 | 28 days supply | Qty: 28 | Fill #0

## 2017-11-23 MED FILL — ATORVASTATIN CALCIUM 80 MG: 80 | 30 days supply | Qty: 30 | Fill #0

## 2017-11-23 MED FILL — BRILINTA 90 MG TABLET: 90 | 30 days supply | Qty: 60 | Fill #0

## 2017-11-23 MED FILL — METOPROLOL TARTRATE 25 MG T: 25 | 30 days supply | Qty: 30 | Fill #0

## 2017-11-23 NOTE — Discharge Summary (Signed)
Discharge Summary    Patient ID: Robert Cox MRN: [id number]; DOB: August 31, 1955  Admit date: 11/21/2017 Discharge date: 11/23/2017  Primary Care Provider: No primary care provider on file.  Primary Cardiologist: Nicki Guadalajara, MD   Discharge Diagnoses    Principal Problem:   STEMI (ST elevation myocardial infarction) [facility]) Active Problems:   STEMI involving left circumflex coronary artery (HCC)   HLD (hyperlipidemia)   Tobacco abuse   CAD S/P percutaneous coronary angioplasty  Allergies No Known Allergies  Diagnostic Studies/Procedures    Cardiac catheterization 11/21/17:   Mid RCA lesion is 30% stenosed.  Prox LAD to Mid LAD lesion is 30% stenosed.  Ost 1st Mrg lesion is 70% stenosed.  Prox Cx to Mid Cx lesion is 100% stenosed.  Post intervention, there is a 0% residual stenosis.  LV end diastolic pressure is normal.  A stent was successfully placed.   Acute coronary syndrome secondary to total occlusion of the circumflex coronary artery with faint distal collateralization of 2 distal branches beyond the occlusion.  Left main is angiographically normal.  The LAD has 30% narrowing.  The circumflex gives rise to 2 high marginal/almost ramus intermediate artery distribution that has proximal eccentric 70% smooth stenosis.  Beyond the second branch the circumflex is occluded and there is no antegrade filling of this bifurcation vessel beyond the occlusion.  The RCA is a large dominant vessel that is 30% smooth mid stenosis.  Successful PCI to the circumflex vessel with ultimate insertion of a 2.75 x 20 mm Synergy DES stent postdilated to 3.0 mm with careful positioning of the stent at the bifurcation so as to not to gel the branch.  There was TIMI-3 flow down both vessels but evidence for small residual thrombus at the ostium of the inferior bifurcating branch.  Aggrastat was started during the procedure due to initial no-flow despite initial PTCA and will be  continued 18 hours post procedure in light of small residual thrombus.  Idioventricular rhythm following reperfusion treated with intravenous metoprolol.  Global ejection fraction is 50% with small area of mid anterolateral hypocontractility.  LVEDP is 11 mm.  RECOMMENDATION: The patient will continue on Aggrastat for 18 hours.  He will be started on post MI beta-blockade, high potency statin therapy, and post MI ACE inhibition with possible nitrates if needed.  Smoking cessation is essential, but the patient has already stated that he will again smoke cigarettes when he is discharged from the hospital. Recommend uninterrupted dual antiplatelet therapy with Aspirin 81mg  daily and Ticagrelor 90mg  twice daily for a minimum of 12 months (ACS - Class I recommendation). _____________   Echocardiogram 11/21/17: Study Conclusions  - Left ventricle: The cavity size was normal. Wall thickness was   increased in a pattern of mild LVH. Systolic function was mildly   reduced. The estimated ejection fraction was in the range of 45%   to 50%. Anterolateral and inferolateral hypokinesis. Doppler   parameters are consistent with abnormal left ventricular   relaxation (grade 1 diastolic dysfunction). - Aortic valve: There was no stenosis. - Mitral valve: There was no significant regurgitation. - Right ventricle: The cavity size was normal. Systolic function   was normal. - Pulmonary arteries: No complete TR doppler jet so unable to   estimate PA systolic pressure. - Inferior vena cava: The vessel was normal in size. The   respirophasic diameter changes were in the normal range (>= 50%),   consistent with normal central venous pressure.  Impressions:  - Normal LV size with  mild LV hypertrophy. EF 45-50%, anterolateral   and inferolateral hypokinesis. Nomral RV size and systolic   function. No significant valvular abnormalities.  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 He has a prior hx of COPD and tobacco use. He reported that he took Tums several times without improvement and his symptoms continued. He reports that he did fall asleep for 2 hours then woke with worsening pain. He described the discomfort as a tightness and heaviness with a burring sensation. He had mild SOB however no associated nausea or diaphoresis. He lives in [place].   Hospital Course    In the ED, EKG showed ST elevation in II, III, AVF.  J point elevation in lateral leads. Code STEMI was activated and pt taken to cath lab for emergent cath. Cath revealed acute coronary syndrome secondary to total occlusion of the circumflex coronary artery with faint distal collateralization of 2 distal branches beyond the occlusion successful PCI to the circumflex vessel was performed. Aggrastat was started during the procedure due to initial no-flow despite initial PTCA. He was started on post MI beta-blockade, high potency statin therapy, and post MI ACE inhibition with possible nitrates if needed. Smoking cessation is essential. Recommendations are for uninterrupted dual antiplatelet therapy with Aspirin 81mg  daily and Ticagrelor 90mg  twice daily for a minimum of 12 months.   Pt did well post cath without residual chest pain or other symptoms. Cath site unremarkable. He has worked with cardiac rehabilitation for diet and cessation recommendations. He has ambulated without complications.   He continues to be rightfully concerned with resumption of driving. Our cardiology team will clear him for regular driving in 3 days if he continues to have no problems. This is not for commercial driving clearance and does not take the place of DOT driving regulations. A long discussion was had between the patient and myself regarding DOT clearance and federal regulations. He has spoken to his Psychologist, occupationalsafety officer with his company.  Plans are to discharge him today and he is going to stay in his truck until Monday. At that time, he will  then have a DOT physical and evaluation for commercial licence clearance. We have discussed the importance of medication compliance after PCI and he verbalizes understanding. I have instructed him to follow up with his PCP as soon as he is back in [place] and then will need referral to a Cardiology team there. [facility] will supply him with enough medication to last until his arrival in [place].   Consultants: None   General: Well developed, well nourished, NAD Skin: Warm, dry, intact  Head: Normocephalic, atraumatic, clear, moist mucus membranes. Neck: Negative for carotid bruits. No JVD Lungs:Clear to ausculation bilaterally. No wheezes, rales, or rhonchi. Breathing is unlabored. Cardiovascular: RRR with S1 S2. No murmurs, rubs, gallops, or LV heave appreciated. Abdomen: Soft, non-tender, non-distended with normoactive bowel sounds. No obvious abdominal masses. MSK: Strength and tone appear normal for age. 5/5 in all extremities Extremities: No edema. No clubbing or cyanosis. DP/PT pulses 2+ bilaterally Neuro: Alert and oriented. No focal deficits. No facial asymmetry. MAE spontaneously. Psych: Responds to questions appropriately with normal affect.    The patient has been seen and examined by Dr. [place] who feels that he is stable and ready for discharge today, 11/23/17.  _____________  Discharge Vitals Blood pressure 91/60, pulse 76,  temperature 98.2 F (36.8 C), temperature source Oral, resp. rate 16, height 5\' 10"  (1.778 m), weight 94.3 kg, SpO2 94 %.  Filed Weights   11/21/17 0815 11/22/17 0500 11/23/17 0608  Weight: 93 kg 96.8 kg 94.3 kg   Labs & Radiologic Studies    CBC Recent Labs    11/21/17 0528 11/21/17 0606 11/21/17 1509 11/22/17 0259  WBC 12.0*  --   --  11.6*  NEUTROABS 8.9*  --   --   --   HGB 14.0 13.6  --  12.9*  HCT 46.2 40.0  --  41.2  MCV 92.4  --   --   90.2  PLT 369  --  304 337   Basic Metabolic Panel Recent Labs    16/10/96 0528 11/21/17 0606 11/21/17 0815 11/22/17 0259  NA 138 139  --  138  K 3.8 3.7  --  3.6  CL 105 104  --  107  CO2 26  --   --  24  GLUCOSE 150* 143*  --  112*  BUN 17 20  --  10  CREATININE 0.94 0.80  --  0.92  CALCIUM 9.0  --   --  8.7*  MG  --   --  2.2  --    Liver Function Tests Recent Labs    11/21/17 0528  AST 17  ALT 15  ALKPHOS 70  BILITOT 0.6  PROT 6.4*  ALBUMIN 3.8   Cardiac Enzymes Recent Labs    11/21/17 0815 11/21/17 1509 11/21/17 2047  TROPONINI 2.43* 9.82* 11.32*   Hemoglobin A1C Recent Labs    11/21/17 0815  HGBA1C 5.4   Fasting Lipid Panel Recent Labs    11/22/17 0259  CHOL 230*  HDL 30*  LDLCALC 177*  TRIG 116  CHOLHDL 7.7   Thyroid Function Tests Recent Labs    11/21/17 0815  TSH 0.966   _____________  Dg Chest Port 1 View  Result Date: 11/21/2017 CLINICAL DATA:  Code ST-elevation myocardial infarction. Acute onset of generalized chest pain. EXAM: PORTABLE CHEST 1 VIEW COMPARISON:  None. FINDINGS: The lungs are well-aerated. Mild left-sided atelectasis is noted. No pleural effusion or pneumothorax is seen. The cardiomediastinal silhouette is within normal limits. No acute osseous abnormalities are seen. IMPRESSION: Mild left-sided atelectasis noted; lungs otherwise clear. Electronically Signed   By: Roanna Raider M.D.   On: 11/21/2017 05:43   Disposition   Pt is being discharged home today in good condition.  Follow-up Plans & Appointments    Discharge Instructions    Amb Referral to Cardiac Rehabilitation   Complete by:  As directed    To [place], [place]   Diagnosis:   Coronary Stents STEMI PTCA     Call MD for:  difficulty breathing, headache or visual disturbances   Complete by:  As directed    Call MD for:  extreme fatigue   Complete by:  As directed    Call MD for:  persistant dizziness or light-headedness   Complete by:  As directed      Call MD for:  persistant nausea and vomiting   Complete by:  As directed    Call MD for:  redness, tenderness, or signs of infection (pain, swelling, redness, odor or green/yellow discharge around incision site)   Complete by:  As directed    Call MD for:  severe uncontrolled pain   Complete by:  As directed    Call MD for:  temperature >100.4   Complete by:  As directed    Diet - low sodium heart healthy   Complete by:  As directed    Discharge instructions   Complete by:  As directed    PLEASE DO NOT MISS ANY DOSES OF YOUR BRILINTA!!!!! Also keep a log of you blood pressures and bring back to your follow up appt. Please call the office with any questions.   Patients taking blood thinners should generally stay away from medicines like ibuprofen, Advil, Motrin, naproxen, and Aleve due to risk of stomach bleeding. You may take Tylenol as directed or talk to your primary doctor about alternatives.  Some studies suggest Prilosec/Omeprazole interacts with Plavix. If you have reflux symptoms, please use Protonix for less chance of interaction.   No driving for 3 days. No lifting over 5 lbs for 1 week. No sexual activity for 1 week. Keep procedure site clean & dry. If you notice increased pain, swelling, bleeding or pus, call/return!  You may shower, but no soaking baths/hot tubs/pools for 1 week.   If you notice any bleeding such as blood in stool, black tarry stools, blood in urine, nosebleeds or any other unusual bleeding, call your doctor immediately.   Increase activity slowly   Complete by:  As directed      Discharge Medications   Allergies as of 11/23/2017   No Known Allergies     Medication List    STOP taking these medications   ibuprofen 200 MG tablet Commonly known as:  ADVIL,MOTRIN     TAKE these medications   acetaminophen 325 MG tablet Commonly known as:  TYLENOL Take 2 tablets (650 mg total) by mouth every 4 (four) hours as needed for headache or mild pain.    aspirin 81 MG chewable tablet Chew 1 tablet (81 mg total) by mouth daily. Start taking on:  11/24/2017   atorvastatin 80 MG tablet Commonly known as:  LIPITOR Take 1 tablet (80 mg total) by mouth daily at 6 PM.   budesonide-formoterol 160-4.5 MCG/ACT inhaler Commonly known as:  SYMBICORT Inhale 2 puffs into the lungs 2 (two) times daily as needed (shortness of breath).   ipratropium-albuterol 0.5-2.5 (3) MG/3ML Soln Commonly known as:  DUONEB Take 3 mLs by nebulization every 6 (six) hours as needed (shortness of breath and wheezing).   metoprolol tartrate 25 MG tablet Commonly known as:  LOPRESSOR Take 0.5 tablets (12.5 mg total) by mouth 2 (two) times daily.   nicotine 14 mg/24hr patch Commonly known as:  NICODERM CQ - dosed in mg/24 hours Place 1 patch (14 mg total) onto the skin daily. Start taking on:  11/24/2017   nitroGLYCERIN 0.4 MG SL tablet Commonly known as:  NITROSTAT Place 1 tablet (0.4 mg total) under the tongue every 5 (five) minutes x 3 doses as needed for chest pain.   tamsulosin 0.4 MG Caps capsule Commonly known as:  FLOMAX Take 0.4 mg by mouth every evening.   ticagrelor 90 MG Tabs tablet Commonly known as:  BRILINTA Take 1 tablet (90 mg total) by mouth 2 (two) times daily.        Acute coronary syndrome (MI, NSTEMI, STEMI, etc) this admission?: Yes.     AHA/ACC Clinical Performance & Quality Measures: 1. Aspirin prescribed? - Yes 2. ADP Receptor Inhibitor (Plavix/Clopidogrel, Brilinta/Ticagrelor or Effient/Prasugrel) prescribed (includes medically managed patients)? - Yes 3. Beta Blocker prescribed? - Yes 4. High Intensity Statin (Lipitor 40-80mg  or Crestor 20-40mg ) prescribed? - Yes 5. EF assessed during THIS hospitalization? - Yes 6. For EF <  40%, was ACEI/ARB prescribed? - Not Applicable (EF >/= 40%) 7. For EF <40%, Aldosterone Antagonist (Spironolactone or Eplerenone) prescribed? - Not Applicable (EF >/= 40%) 8. Cardiac Rehab Phase II ordered  (Included Medically managed Patients)? - Yes   Outstanding Labs/Studies   Follow with PCP in [place]   Duration of Discharge Encounter   Greater than 30 minutes including physician time.  Signed, Georgie Chard, NP 11/23/2017, 10:05 AM

## 2017-11-23 NOTE — Progress Notes (Signed)
Pt doing well, has been walking. Gave him stent card and discussed [facility] locations. Unfortunately the closest to him is about one hour away. [place]  [phone] Ethelda ChickKristan Gabryel Files CES, ACSM 10:22 AM 11/23/2017

## 2017-11-23 NOTE — Progress Notes (Signed)
Reviewed all d/c instructions with patient, copy given, verbalized understanding.  Awaiting Medications for d/c.

## 2017-11-23 NOTE — Care Management (Signed)
1214 11-23-17 Tomi BambergerBrenda Graves-Bigelow, RN,BSN Case Manager.   CM [place] [facility] [place] [facility] [place] [facility] [place]. No further needs from CM at this time. Gala LewandowskyGraves-Bigelow, Lunabelle Oatley Kaye, RN,BSN [phone]

## 2019-10-23 IMAGING — DX DG CHEST 1V PORT
1 series · 1 of 1 positions shown · non-contrast
Comparison: None.

CLINICAL DATA: Code ST-elevation myocardial infarction. Acute onset
of generalized chest pain.

EXAM:
PORTABLE CHEST 1 VIEW

[chest]
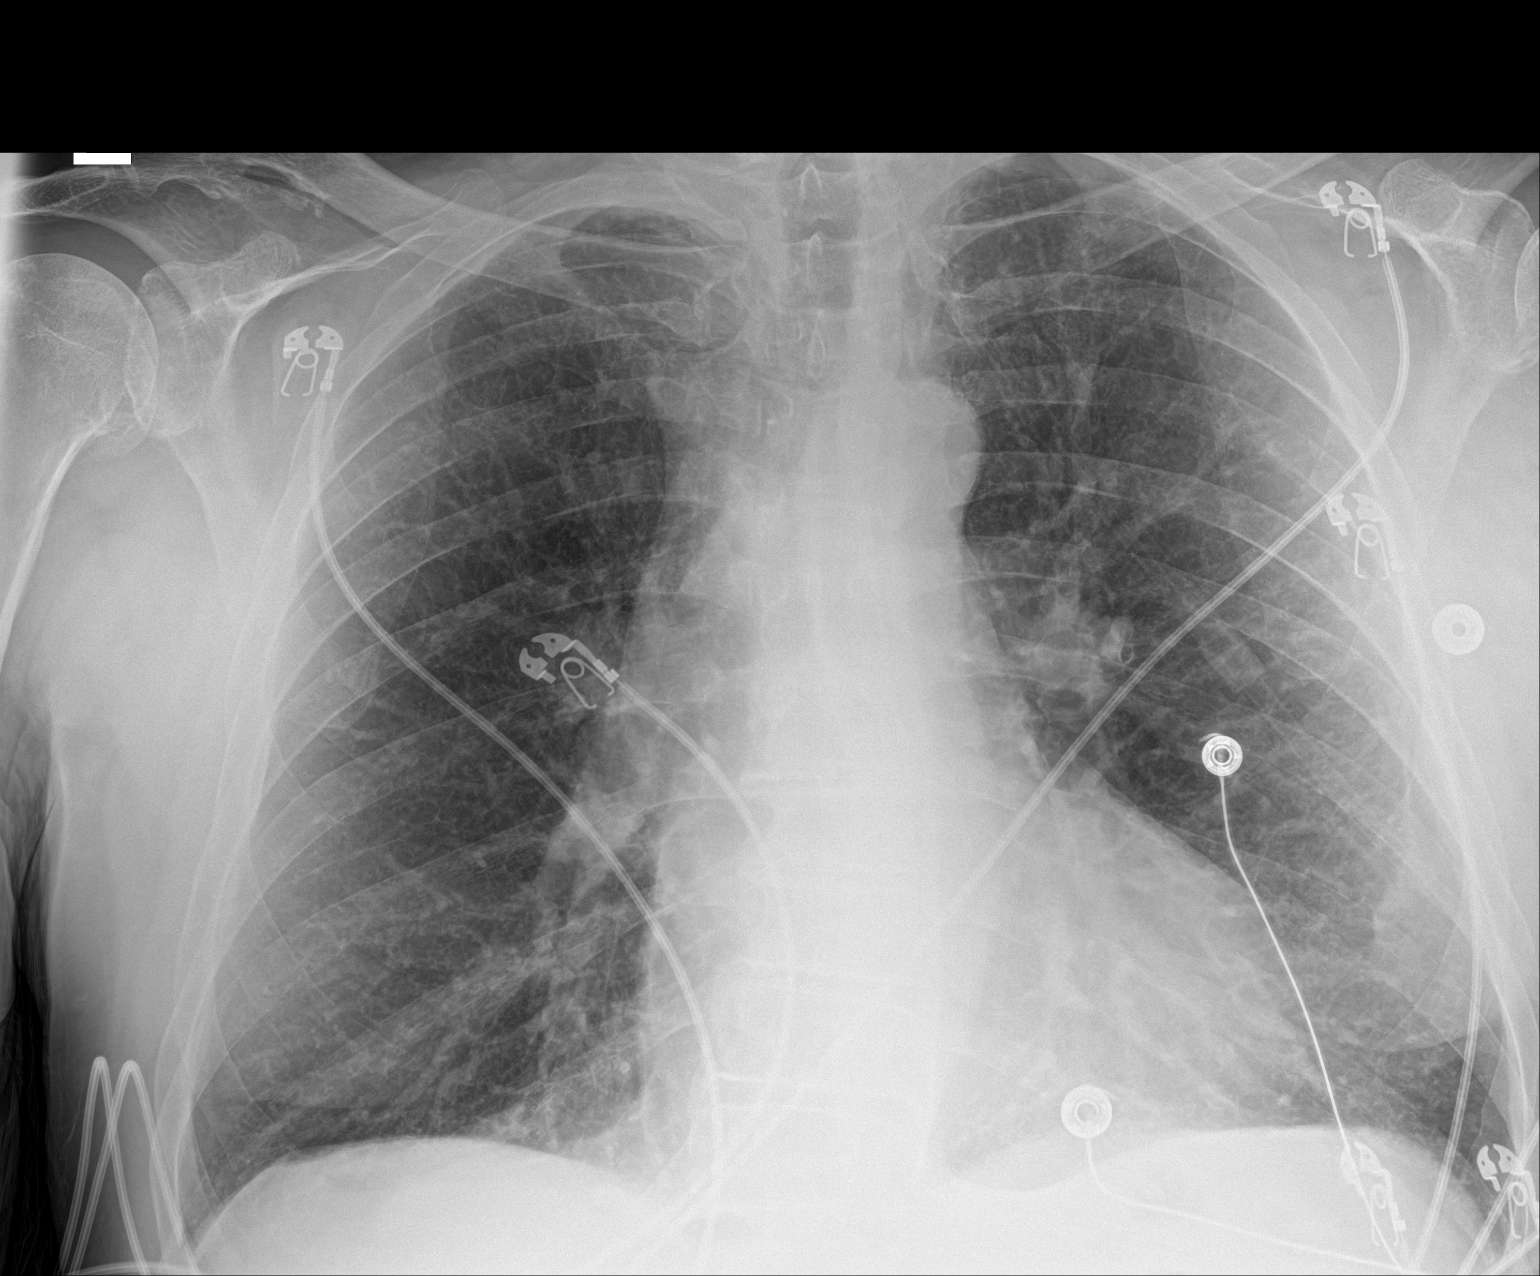

[1 of 1 positions shown; findings below may reference images not displayed]

FINDINGS: The lungs are well-aerated. Mild left-sided atelectasis is noted. No
pleural effusion or pneumothorax is seen.

The cardiomediastinal silhouette is within normal limits. No acute
osseous abnormalities are seen.
IMPRESSION: Mild left-sided atelectasis noted; lungs otherwise clear.

## 2022-12-12 DEATH — deceased
